# Patient Record
Sex: Female | Born: 2000 | Race: White | Hispanic: No | Marital: Single | Smoking: Current every day smoker
Health system: Southern US, Community
[De-identification: ages and names within clinical notes are randomized; demographics above are authoritative.]

## PROBLEM LIST (undated history)

## (undated) HISTORY — PX: TONSILLECTOMY: SUR1361

---
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
## 2020-03-18 ENCOUNTER — Ambulatory Visit: Payer: Self-pay | Attending: Internal Medicine

## 2020-03-18 DIAGNOSIS — Z23 Encounter for immunization: Secondary | ICD-10-CM

## 2020-03-18 NOTE — Progress Notes (Signed)
   Covid-19 Vaccination Clinic  Name:  Jamie Hunter    MRN: [id number] DOB: 29-Dec-2000  03/18/2020  Ms. Knappenberger was observed post Covid-19 immunization forwithout incident. She was provided with Vaccine Information Sheet and instruction to access the V-Safe system.   Ms. Dinan was instructed to call 911 with any severe reactions post vaccine: Marland Kitchen Difficulty breathing  . Swelling of face and throat  . A fast heartbeat  . A bad rash all over body  . Dizziness and weakness   Immunizations Administered    Name Date Dose VIS Date Route   Pfizer COVID-19 Vaccine 03/18/2020 11:03 AM 0.3 mL 10/31/2018 Intramuscular   Manufacturer: [employer], [employer]   Lot: [id number]   NDC: [address]-1

## 2020-03-18 NOTE — Progress Notes (Signed)
   Covid-19 Vaccination Clinic  Name:  Jamie Hunter    MRN: [id number] DOB: 2001/01/24  03/18/2020  [name] was observed post Covid-19 immunization for 15 minutes without incident. She was provided with Vaccine Information Sheet and instruction to access the V-Safe system.   [name] was instructed to call 911 with any severe reactions post vaccine: Marland Kitchen Difficulty breathing  . Swelling of face and throat  . A fast heartbeat  . A bad rash all over body  . Dizziness and weakness   Immunizations Administered    Name Date Dose VIS Date Route   Pfizer COVID-19 Vaccine 03/18/2020 11:03 AM 0.3 mL 10/31/2018 Intramuscular   Manufacturer: [employer], [employer]   Lot: [id number]   NDC: [address]-4

## 2020-04-07 ENCOUNTER — Ambulatory Visit: Payer: Self-pay

## 2020-04-10 ENCOUNTER — Ambulatory Visit: Payer: Self-pay

## 2021-05-12 ENCOUNTER — Other Ambulatory Visit: Payer: Self-pay | Admitting: Gastroenterology

## 2021-05-12 DIAGNOSIS — R198 Other specified symptoms and signs involving the digestive system and abdomen: Secondary | ICD-10-CM

## 2021-05-12 DIAGNOSIS — R1033 Periumbilical pain: Secondary | ICD-10-CM

## 2021-05-26 ENCOUNTER — Ambulatory Visit
Admission: RE | Admit: 2021-05-26 | Discharge: 2021-05-26 | Disposition: A | Discharge status: Home / Self Care | Payer: BC Managed Care – PPO | Source: Ambulatory Visit | Attending: Gastroenterology | Admitting: Gastroenterology

## 2021-05-26 ENCOUNTER — Other Ambulatory Visit: Payer: Self-pay

## 2021-05-26 DIAGNOSIS — R198 Other specified symptoms and signs involving the digestive system and abdomen: Secondary | ICD-10-CM

## 2021-05-26 DIAGNOSIS — R1033 Periumbilical pain: Secondary | ICD-10-CM | POA: Diagnosis present

## 2021-05-26 MED ORDER — IOHEXOL 350 MG/ML SOLN
150.0000 mL | Freq: Once | INTRAVENOUS | Status: AC | PRN
  Administered 2021-05-26: 85 mL via INTRAVENOUS

## 2022-11-26 IMAGING — CT CT ABD-PELV W/ CM
1 of 2 series · 15 of 32 positions shown, 19 images · IV contrast (APPLIED)
Comparison: CT September 18, 2010

CLINICAL DATA: Two years of severe nausea and vomiting. Change in
bowel habits.

EXAM:
CT ABDOMEN AND PELVIS WITH CONTRAST
TECHNIQUE: Multidetector CT imaging of the abdomen and pelvis was performed
using the standard protocol following bolus administration of
intravenous contrast.
CONTRAST:  85mL OMNIPAQUE IOHEXOL 350 MG/ML SOLN

[Series 2: axial st · axial · 0.60mm/px · z∈[-1034,-594]mm · 15 of 98 slices shown, 19 images]
[im 5/98  soft-tissue]
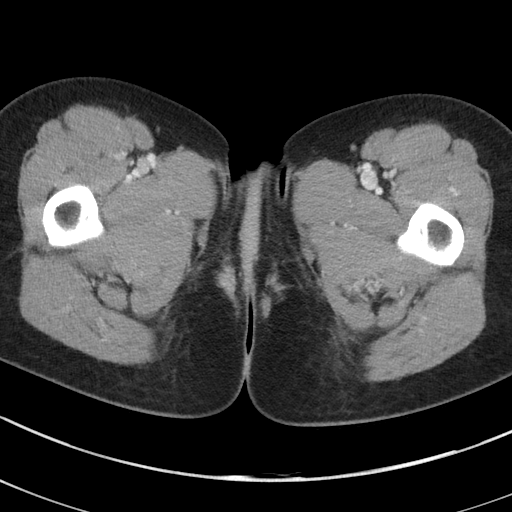
[im 5/98  bone]
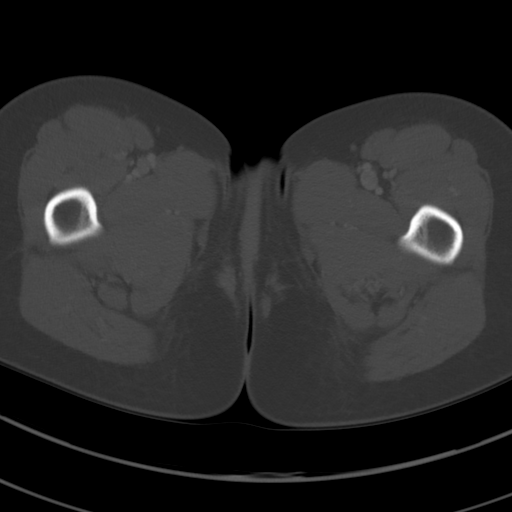
[im 13/98  soft-tissue]
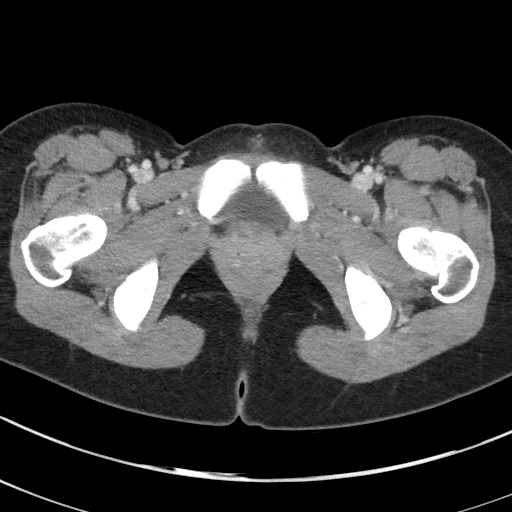
[im 21/98  soft-tissue]
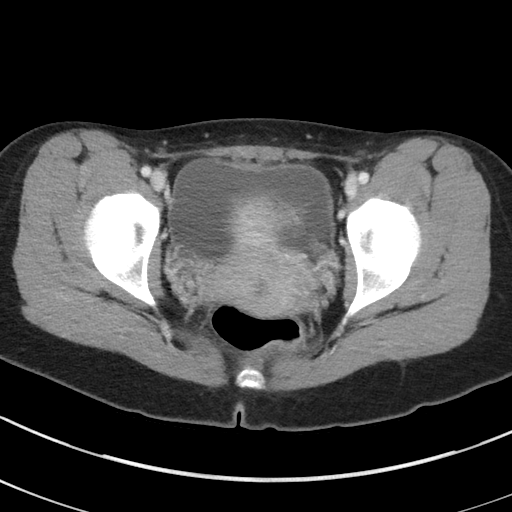
[im 29/98  soft-tissue]
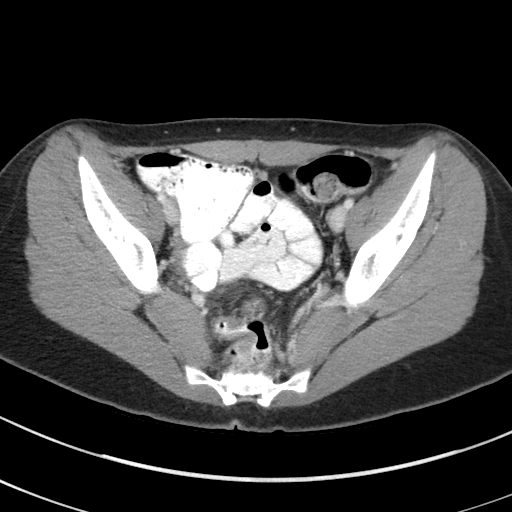
[im 33/98  soft-tissue]
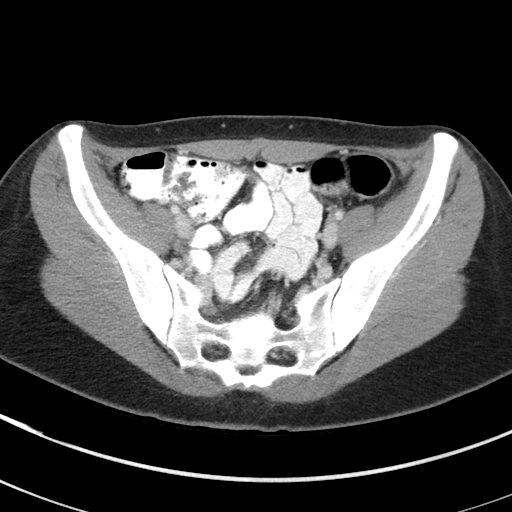
[im 41/98  soft-tissue]
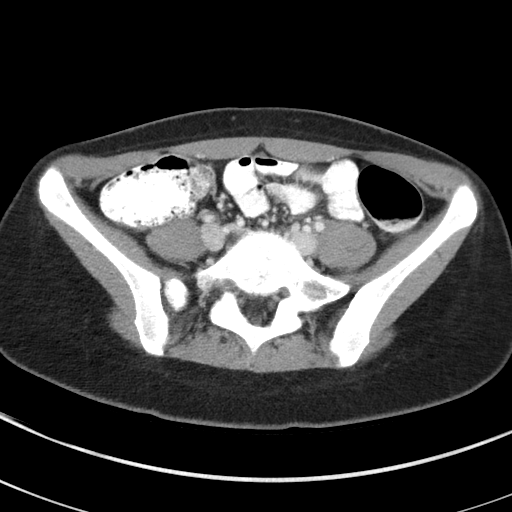
[im 49/98  soft-tissue]
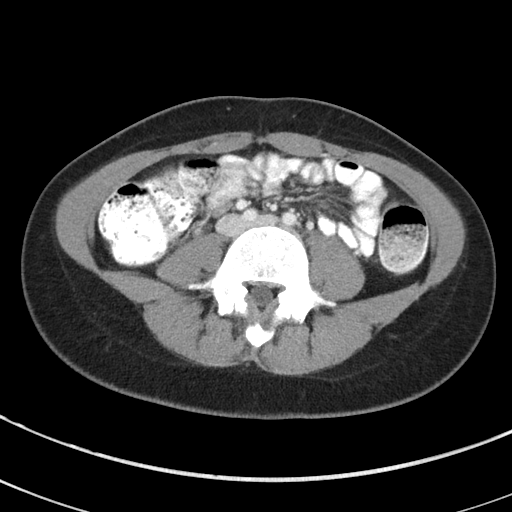
[im 57/98  soft-tissue]
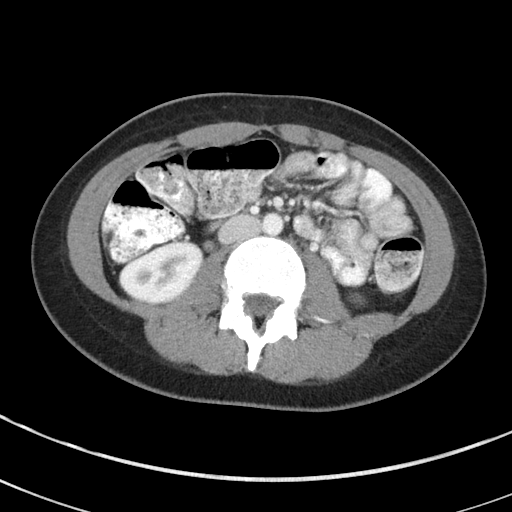
[im 65/98  soft-tissue]
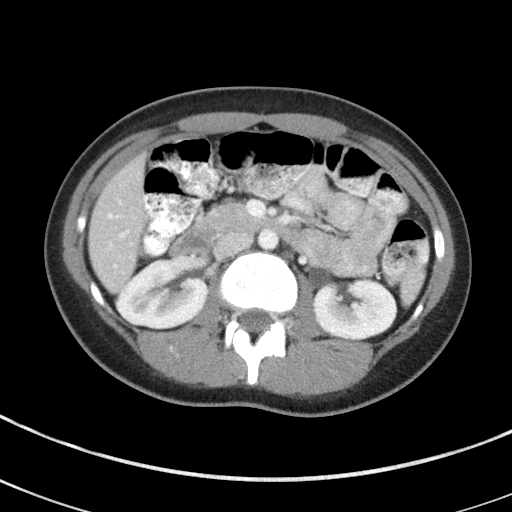
[im 65/98  bone]
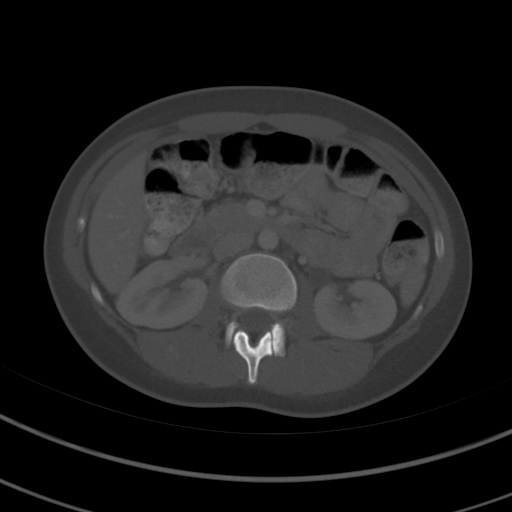
[im 69/98  soft-tissue]
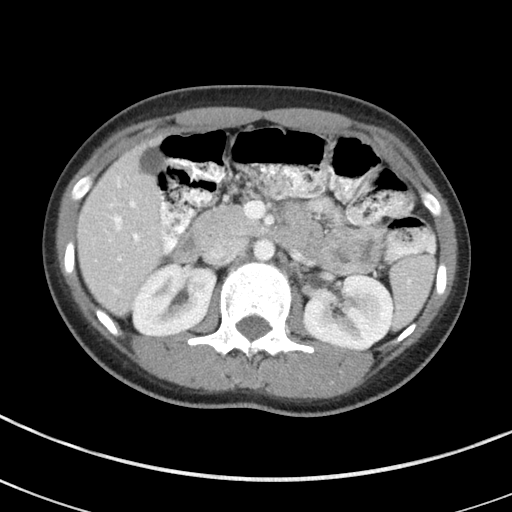
[im 77/98  soft-tissue]
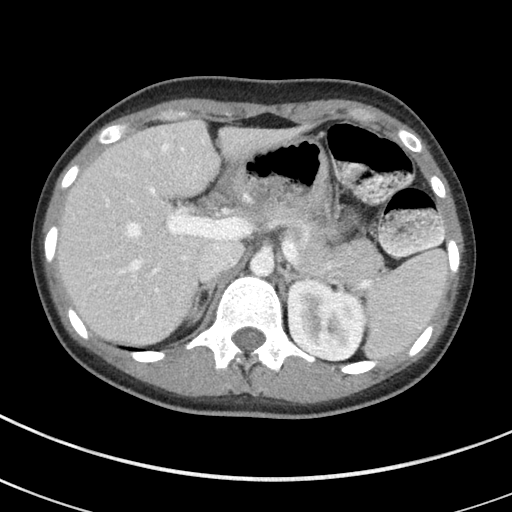
[im 81/98  lung]
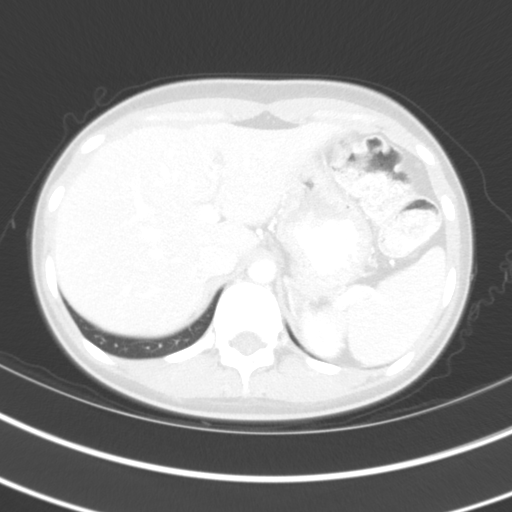
[im 85/98  soft-tissue]
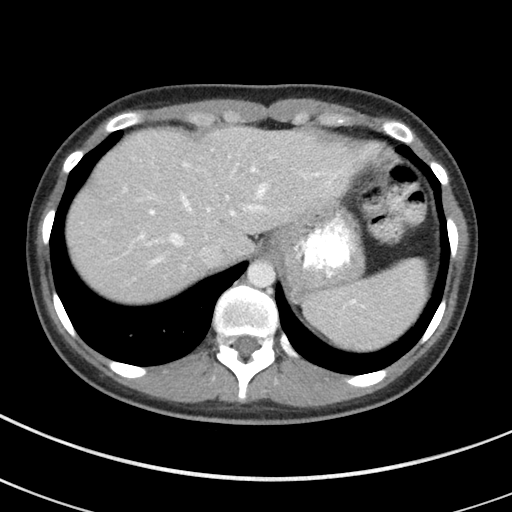
[im 85/98  lung]
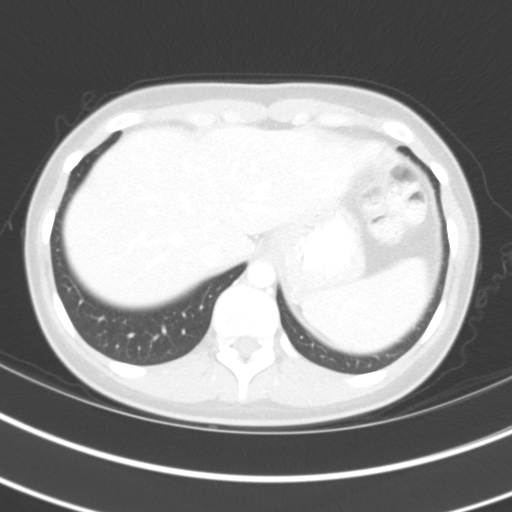
[im 89/98  lung]
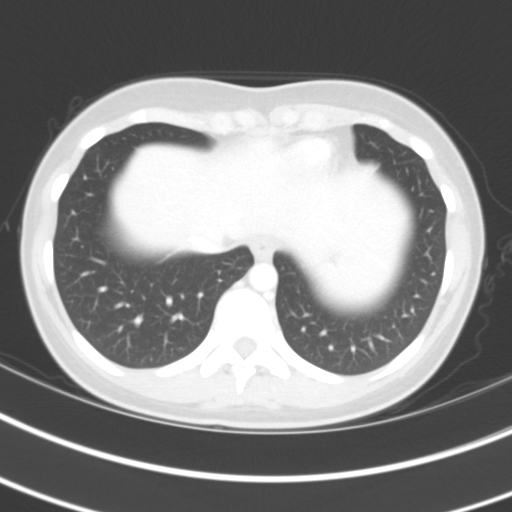
[im 93/98  soft-tissue]
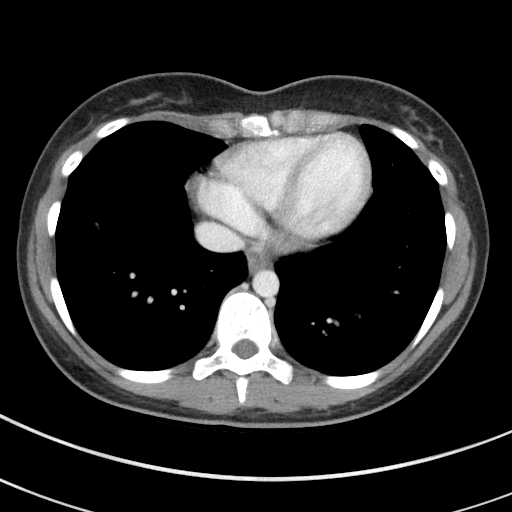
[im 93/98  lung]
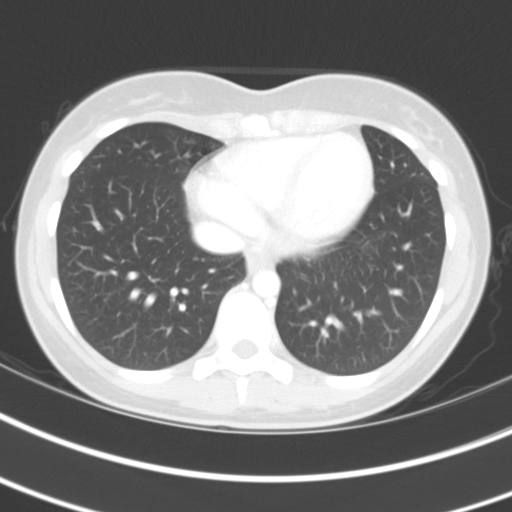

[15 of 32 positions shown; findings below may reference images not displayed]

FINDINGS: Lower chest: No acute abnormality.

Hepatobiliary: No focal liver abnormality is seen. No gallstones,
gallbladder wall thickening, or biliary dilatation.

Pancreas: Unremarkable. No pancreatic ductal dilatation or
surrounding inflammatory changes.

Spleen: Normal in size without focal abnormality.

Adrenals/Urinary Tract: Adrenal glands are unremarkable. Kidneys are
normal, without renal calculi, solid enhancing lesion, or
hydronephrosis. Bladder is unremarkable for degree of distension.

Stomach/Bowel: Radiopaque enteric contrast traverses the sigmoid
colon. Tiny hiatal hernia otherwise the stomach is unremarkable for
degree of distension. No pathologic dilation of small or large
bowel. The appendix and terminal ileum are unremarkable. Moderate
volume of formed stool throughout the colon. No colonic wall
thickening or mass like lesions. No evidence of bowel inflammation.

Vascular/Lymphatic: No significant vascular findings are present. No
enlarged abdominal or pelvic lymph nodes.

Reproductive: Uterus and bilateral adnexa are unremarkable.

Other: No abdominal wall hernia or abnormality. No abdominopelvic
ascites.

Musculoskeletal: No acute or significant osseous findings.
IMPRESSION: 1. No acute abdominopelvic findings.
2. Moderate volume of formed stool throughout the colon. No evidence
of bowel inflammation.
